# Patient Record
Sex: Female | Born: 1970 | Race: White | Hispanic: No | Marital: Married
Health system: Southern US, Community
[De-identification: ages and names within clinical notes are randomized; demographics above are authoritative.]

---

## 2005-06-15 ENCOUNTER — Emergency Department (HOSPITAL_COMMUNITY): Admission: EM | Admit: 2005-06-15 | Discharge: 2005-06-15 | Payer: Self-pay | Admitting: Emergency Medicine
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
## 2016-08-26 ENCOUNTER — Other Ambulatory Visit: Payer: Self-pay | Admitting: Gastroenterology

## 2016-08-26 DIAGNOSIS — R1084 Generalized abdominal pain: Secondary | ICD-10-CM

## 2016-08-29 ENCOUNTER — Ambulatory Visit
Admission: RE | Admit: 2016-08-29 | Discharge: 2016-08-29 | Disposition: A | Discharge status: Home / Self Care | Payer: BC Managed Care – PPO | Source: Ambulatory Visit | Attending: Gastroenterology | Admitting: Gastroenterology

## 2016-08-29 DIAGNOSIS — R1084 Generalized abdominal pain: Secondary | ICD-10-CM

## 2016-08-29 MED ORDER — IOPAMIDOL (ISOVUE-300) INJECTION 61%
100.0000 mL | Freq: Once | INTRAVENOUS | Status: AC | PRN
  Administered 2016-08-29: 100 mL via INTRAVENOUS

## 2022-04-26 ENCOUNTER — Encounter: Payer: Self-pay | Admitting: Gastroenterology

## 2022-10-31 ENCOUNTER — Emergency Department (HOSPITAL_COMMUNITY): Payer: BC Managed Care – PPO

## 2022-10-31 ENCOUNTER — Encounter (HOSPITAL_COMMUNITY): Payer: Self-pay

## 2022-10-31 ENCOUNTER — Emergency Department (HOSPITAL_COMMUNITY)
Admission: EM | Admit: 2022-10-31 | Discharge: 2022-10-31 | Disposition: A | Discharge status: Other Acute Inpatient Hospital | Payer: BC Managed Care – PPO | Attending: Emergency Medicine | Admitting: Emergency Medicine

## 2022-10-31 DIAGNOSIS — W28XXXA Contact with powered lawn mower, initial encounter: Secondary | ICD-10-CM | POA: Insufficient documentation

## 2022-10-31 DIAGNOSIS — S0181XA Laceration without foreign body of other part of head, initial encounter: Secondary | ICD-10-CM | POA: Insufficient documentation

## 2022-10-31 DIAGNOSIS — S68619A Complete traumatic transphalangeal amputation of unspecified finger, initial encounter: Secondary | ICD-10-CM | POA: Diagnosis not present

## 2022-10-31 DIAGNOSIS — Z23 Encounter for immunization: Secondary | ICD-10-CM | POA: Insufficient documentation

## 2022-10-31 DIAGNOSIS — S6991XA Unspecified injury of right wrist, hand and finger(s), initial encounter: Secondary | ICD-10-CM | POA: Diagnosis present

## 2022-10-31 DIAGNOSIS — S68119A Complete traumatic metacarpophalangeal amputation of unspecified finger, initial encounter: Secondary | ICD-10-CM

## 2022-10-31 LAB — COMPREHENSIVE METABOLIC PANEL
ALT: 22 U/L (ref 0–44)
AST: 29 U/L (ref 15–41)
Albumin: 3.7 g/dL (ref 3.5–5.0)
Alkaline Phosphatase: 38 U/L (ref 38–126)
Anion gap: 10 (ref 5–15)
BUN: 14 mg/dL (ref 6–20)
CO2: 20 mmol/L — ABNORMAL LOW (ref 22–32)
Calcium: 8.3 mg/dL — ABNORMAL LOW (ref 8.9–10.3)
Chloride: 109 mmol/L (ref 98–111)
Creatinine, Ser: 0.74 mg/dL (ref 0.44–1.00)
GFR, Estimated: >60 mL/min (ref >60)
Glucose, Bld: 93 mg/dL (ref 70–99)
Potassium: 3 mmol/L — ABNORMAL LOW (ref 3.5–5.1)
Sodium: 139 mmol/L (ref 135–145)
Total Bilirubin: 0.5 mg/dL (ref 0.3–1.2)
Total Protein: 5.8 g/dL — ABNORMAL LOW (ref 6.5–8.1)

## 2022-10-31 LAB — I-STAT CHEM 8, ED
BUN: 17 mg/dL (ref 6–20)
Calcium, Ion: 1.08 mmol/L — ABNORMAL LOW (ref 1.15–1.40)
Chloride: 108 mmol/L (ref 98–111)
Creatinine, Ser: 0.8 mg/dL (ref 0.44–1.00)
Glucose, Bld: 91 mg/dL (ref 70–99)
HCT: 39 % (ref 36.0–46.0)
Hemoglobin: 13.3 g/dL (ref 12.0–15.0)
Potassium: 3.1 mmol/L — ABNORMAL LOW (ref 3.5–5.1)
Sodium: 142 mmol/L (ref 135–145)
TCO2: 21 mmol/L — ABNORMAL LOW (ref 22–32)

## 2022-10-31 LAB — CBC
HCT: 40.1 % (ref 36.0–46.0)
Hemoglobin: 13 g/dL (ref 12.0–15.0)
MCH: 31.2 pg (ref 26.0–34.0)
MCHC: 32.4 g/dL (ref 30.0–36.0)
MCV: 96.2 fL (ref 80.0–100.0)
Platelets: 228 10*3/uL (ref 150–400)
RBC: 4.17 MIL/uL (ref 3.87–5.11)
RDW: 12.3 % (ref 11.5–15.5)
WBC: 7.4 10*3/uL (ref 4.0–10.5)
nRBC: 0 % (ref 0.0–0.2)

## 2022-10-31 LAB — SAMPLE TO BLOOD BANK

## 2022-10-31 LAB — PROTIME-INR
INR: 1 (ref 0.8–1.2)
Prothrombin Time: 13 seconds (ref 11.4–15.2)

## 2022-10-31 LAB — LACTIC ACID, PLASMA: Lactic Acid, Venous: 2.4 mmol/L (ref 0.5–1.9)

## 2022-10-31 LAB — ETHANOL: Alcohol, Ethyl (B): 19 mg/dL — ABNORMAL HIGH (ref <10)

## 2022-10-31 MED ORDER — ONDANSETRON HCL 4 MG/2ML IJ SOLN
4.0000 mg | Freq: Once | INTRAMUSCULAR | Status: AC
  Administered 2022-10-31: 4 mg via INTRAVENOUS
  Filled 2022-10-31: qty 2

## 2022-10-31 MED ORDER — LIDOCAINE-EPINEPHRINE (PF) 2 %-1:200000 IJ SOLN
10.0000 mL | Freq: Once | INTRAMUSCULAR | Status: AC
  Administered 2022-10-31: 10 mL
  Filled 2022-10-31: qty 20

## 2022-10-31 MED ORDER — HYDROMORPHONE HCL 1 MG/ML IJ SOLN
1.0000 mg | Freq: Once | INTRAMUSCULAR | Status: AC
  Administered 2022-10-31: 1 mg via INTRAVENOUS
  Filled 2022-10-31: qty 1

## 2022-10-31 MED ORDER — TETANUS-DIPHTH-ACELL PERTUSSIS 5-2.5-18.5 LF-MCG/0.5 IM SUSY
0.5000 mL | PREFILLED_SYRINGE | Freq: Once | INTRAMUSCULAR | Status: AC
  Administered 2022-10-31: 0.5 mL via INTRAMUSCULAR

## 2022-10-31 MED ORDER — HYDROMORPHONE HCL 1 MG/ML IJ SOLN
0.5000 mg | Freq: Once | INTRAMUSCULAR | Status: AC
  Administered 2022-10-31: 0.5 mg via INTRAVENOUS
  Filled 2022-10-31: qty 1
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
